# Patient Record
Sex: Male | Born: 1960 | Race: White | Hispanic: No | Marital: Single | Smoking: Never smoker
Health system: Southern US, Community
[De-identification: ages and names within clinical notes are randomized; demographics above are authoritative.]

---

## 2013-10-14 ENCOUNTER — Ambulatory Visit: Payer: Self-pay | Admitting: Physician Assistant

## 2015-06-29 ENCOUNTER — Encounter: Payer: Self-pay | Admitting: Emergency Medicine

## 2015-06-29 ENCOUNTER — Ambulatory Visit (INDEPENDENT_AMBULATORY_CARE_PROVIDER_SITE_OTHER): Payer: Self-pay

## 2015-06-29 ENCOUNTER — Ambulatory Visit
Admission: EM | Admit: 2015-06-29 | Discharge: 2015-06-29 | Disposition: A | Discharge status: Home / Self Care | Payer: Self-pay | Attending: Family Medicine | Admitting: Family Medicine

## 2015-06-29 DIAGNOSIS — S022XXA Fracture of nasal bones, initial encounter for closed fracture: Secondary | ICD-10-CM

## 2015-06-29 DIAGNOSIS — T148XXA Other injury of unspecified body region, initial encounter: Secondary | ICD-10-CM

## 2015-06-29 DIAGNOSIS — R040 Epistaxis: Secondary | ICD-10-CM

## 2015-06-29 DIAGNOSIS — T148 Other injury of unspecified body region: Secondary | ICD-10-CM

## 2015-06-29 MED ORDER — AMOXICILLIN-POT CLAVULANATE 875-125 MG PO TABS: 1.0000 | ORAL_TABLET | Freq: Two times a day (BID) | ORAL | Status: AC

## 2015-06-29 MED ORDER — TRIPLE ANTIBIOTIC 5-400-5000 EX OINT: TOPICAL_OINTMENT | Freq: Two times a day (BID) | CUTANEOUS | Status: AC

## 2015-06-29 MED ORDER — OXYMETAZOLINE HCL 0.05 % NA SOLN: 1.0000 | Freq: Two times a day (BID) | NASAL | Status: AC

## 2015-06-29 MED ORDER — ACETAMINOPHEN 500 MG PO TABS
1000.0000 mg | ORAL_TABLET | Freq: Four times a day (QID) | ORAL | Status: AC | PRN
Start: 2015-06-29 — End: ?

## 2015-06-29 NOTE — ED Notes (Signed)
Ice pack applied to nose.

## 2015-06-29 NOTE — Discharge Instructions (Signed)
Nasal Fracture A nasal fracture is a break or crack in the bones or cartilage of the nose. Minor breaks do not require treatment. These breaks usually heal on their own after about one month. Serious breaks may require surgery. CAUSES This injury is usually caused by a blunt injury to the nose. This type of injury often occurs from:  Contact sports.  Car accidents.  Falls.  Getting punched. SYMPTOMS Symptoms of this injury include:  Pain.  Swelling of the nose.  Bleeding from the nose.  Bruising around the nose or eyes. This may include having black eyes.  Crooked appearance of the nose. DIAGNOSIS This injury may be diagnosed with a physical exam. The health care provider will gently feel the nose for signs of broken bones. He or she will look inside the nostrils to make sure that there is not a blood-filled swelling on the dividing wall between the nostrils (septal hematoma). X-rays of the nose may not show a nasal fracture even when one is present. In some cases, X-rays or a CT scan may be done 1-5 days after the injury. Sometimes, the health care provider will want to wait until the swelling has gone down. TREATMENT Often, minor fractures that have caused no deformity do not require treatment. More serious fractures in which bones have moved out of position may require surgery, which will take place after the swelling is gone. Surgery will stabilize and align the fracture. In some cases, a health care provider may be able to reposition the bones without surgery. This may be done in the health care provider's office after medicine is given to numb the area (local anesthetic). HOME CARE INSTRUCTIONS  If directed, apply ice to the injured area:  Put ice in a plastic bag.  Place a towel between your skin and the bag.  Leave the ice on for 20 minutes, 2-3 times per day.  Take over-the-counter and prescription medicines only as told by your health care provider.  If your nose  starts to bleed, sit in an upright position while you squeeze the soft parts of your nose against the dividing wall between your nostrils (septum) for 10 minutes.  Try to avoid blowing your nose.  Return to your normal activities as told by your health care provider. Ask your health care provider what activities are safe for you.  Avoid contact sports for 3-4 weeks or as told by your health care provider.  Keep all follow-up visits as told by your health care provider. This is important. SEEK MEDICAL CARE IF:  Your pain increases or becomes severe.  You continue to have nosebleeds.  The shape of your nose does not return to normal within 5 days.  You have pus draining out of your nose. SEEK IMMEDIATE MEDICAL CARE IF:  You have bleeding from your nose that does not stop after you pinch your nostrils closed for 20 minutes and keep ice on your nose.  You have clear fluid draining out of your nose.  You notice a grape-like swelling on the septum. This swelling is a collection of blood (hematoma) that must be drained to help prevent infection.  You have difficulty moving your eyes.  You have repeated vomiting.   This information is not intended to replace advice given to you by your health care provider. Make sure you discuss any questions you have with your health care provider.   Document Released: 07/01/2000 Document Revised: 03/25/2015 Document Reviewed: 08/11/2014 Elsevier Interactive Patient Education [employer].  Nosebleed Nosebleeds are common. They are due to a crack in the inside lining of your nose (mucous membrane) or from a small blood vessel that starts to bleed. Nosebleeds can be caused by many conditions, such as injury, infections, dry mucous membranes or dry climate, medicines, nose picking, and home heating and cooling systems. Most nosebleeds come from blood vessels in the front of your nose. HOME CARE INSTRUCTIONS   Try controlling your nosebleed by  pinching your nostrils gently and continuously for at least 10 minutes.  Avoid blowing or sniffing your nose for a number of hours after having a nosebleed.  Do not put gauze inside your nose yourself. If your nose was packed by your health care provider, try to maintain the pack inside of your nose until your health care provider removes it.  If a gauze pack was used and it starts to fall out, gently replace it or cut off the end of it.  If a balloon catheter was used to pack your nose, do not cut or remove it unless your health care provider has instructed you to do that.  Avoid lying down while you are having a nosebleed. Sit up and lean forward.  Use a nasal spray decongestant to help with a nosebleed as directed by your health care provider.  Do not use petroleum jelly or mineral oil in your nose. These can drip into your lungs.  Maintain humidity in your home by using less air conditioning or by using a humidifier.  Aspirinand blood thinners make bleeding more likely. If you are prescribed these medicines and you suffer from nosebleeds, ask your health care provider if you should stop taking the medicines or adjust the dose. Do not stop medicines unless directed by your health care provider  Resume your normal activities as you are able, but avoid straining, lifting, or bending at the waist for several days.  If your nosebleed was caused by dry mucous membranes, use over-the-counter saline nasal spray or gel. This will keep the mucous membranes moist and allow them to heal. If you must use a lubricant, choose the water-soluble variety. Use it only sparingly, and do not use it within several hours of lying down.  Keep all follow-up visits as directed by your health care provider. This is important. SEEK MEDICAL CARE IF:  You have a fever.  You get frequent nosebleeds.  You are getting nosebleeds more often. SEEK IMMEDIATE MEDICAL CARE IF:  Your nosebleed lasts longer than 20  minutes.  Your nosebleed occurs after an injury to your face, and your nose looks crooked or broken.  You have unusual bleeding from other parts of your body.  You have unusual bruising on other parts of your body.  You feel light-headed or you faint.  You become sweaty.  You vomit blood.  Your nosebleed occurs after a head injury.   This information is not intended to replace advice given to you by your health care provider. Make sure you discuss any questions you have with your health care provider.   Document Released: 04/13/2005 Document Revised: 07/25/2014 Document Reviewed: 02/17/2014 Elsevier Interactive Patient Education 2016 [employer] Inc[employer] Abrasion An abrasion is a cut or scrape on the outer surface of your skin. An abrasion does not extend through all of the layers of your skin. It is important to care for your abrasion properly to prevent infection. CAUSES Most abrasions are caused by falling on or gliding across the ground or another surface. When your skin rubs  on something, the outer and inner layer of skin rubs off.  SYMPTOMS A cut or scrape is the main symptom of this condition. The scrape may be bleeding, or it may appear red or pink. If there was an associated fall, there may be an underlying bruise. DIAGNOSIS An abrasion is diagnosed with a physical exam. TREATMENT Treatment for this condition depends on how large and deep the abrasion is. Usually, your abrasion will be cleaned with water and mild soap. This removes any dirt or debris that may be stuck. An antibiotic ointment may be applied to the abrasion to help prevent infection. A bandage (dressing) may be placed on the abrasion to keep it clean. You may also need a tetanus shot. HOME CARE INSTRUCTIONS Medicines  Take or apply medicines only as directed by your health care provider.  If you were prescribed an antibiotic ointment, finish all of it even if you start to feel better. Wound Care  Clean the  wound with mild soap and water 2-3 times per day or as directed by your health care provider. Pat your wound dry with a clean towel. Do not rub it.  There are many different ways to close and cover a wound. Follow instructions from your health care provider about:  Wound care.  Dressing changes and removal.  Check your wound every day for signs of infection. Watch for:  Redness, swelling, or pain.  Fluid, blood, or pus. General Instructions  Keep the dressing dry as directed by your health care provider. Do not take baths, swim, use a hot tub, or do anything that would put your wound underwater until your health care provider approves.  If there is swelling, raise (elevate) the injured area above the level of your heart while you are sitting or lying down.  Keep all follow-up visits as directed by your health care provider. This is important. SEEK MEDICAL CARE IF:  You received a tetanus shot and you have swelling, severe pain, redness, or bleeding at the injection site.  Your pain is not controlled with medicine.  You have increased redness, swelling, or pain at the site of your wound. SEEK IMMEDIATE MEDICAL CARE IF:  You have a red streak going away from your wound.  You have a fever.  You have fluid, blood, or pus coming from your wound.  You notice a bad smell coming from your wound or your dressing.   This information is not intended to replace advice given to you by your health care provider. Make sure you discuss any questions you have with your health care provider.   Document Released: 04/13/2005 Document Revised: 03/25/2015 Document Reviewed: 07/02/2014 Elsevier Interactive Patient Education [employer]

## 2015-06-29 NOTE — ED Notes (Signed)
Patient states that he got hit in the nose by a bucket.  Patient states that [place] and it bounced off and hit him in the nose.  Patient has bleeding and swelling from his nose.

## 2015-06-29 NOTE — ED Provider Notes (Signed)
CSN: [id number]     Arrival date & time 06/29/15  0940 History   First MD Initiated Contact with Patient 06/29/15 1022     Chief Complaint  Patient presents with  . Facial Injury  . Epistaxis   (Consider location/radiation/quality/duration/timing/severity/associated sxs/prior Treatment) HPI Comments: [place] male single here for evaluation of nose/nosebleed "think I broke it"  Feeding bull this am and bull charged him swung empty feed bucket and accidentally hit self in nose  "I feel like my nose bones loose  Nosebleed slowing down now on aspirin daily  Anxious not hurting bad occurred couple hours ago  Denied LOC, visual changes, dyspnea, nausea, vomiting, headache; finished chores and came here denied history nasal fracture   Denied hypertension, cancer PMHx  Denied PSHx  Denied FHx cancer  Patient is a 54 y.o. male presenting with facial injury and nosebleeds. The history is provided by the patient.  Facial Injury Mechanism of injury:  Direct blow Location:  Nose Time since incident:  2 hours Pain details:    Quality:  Aching   Severity:  Moderate   Duration:  2 hours   Timing:  Constant   Progression:  Unchanged Chronicity:  New Relieved by:  Nothing Worsened by:  Movement and pressure Ineffective treatments:  Ice pack Associated symptoms: congestion and epistaxis   Associated symptoms: no altered mental status, no difficulty breathing, no double vision, no ear pain, no headaches, no loss of consciousness, no malocclusion, no nausea, no neck pain, no rhinorrhea, no trismus, no vomiting and no wheezing   Congestion:    Location:  Nasal   Interferes with sleep: no     Interferes with eating/drinking: no   Risk factors: trauma   Risk factors: no alcohol use, no bone disorder, no concern for non-accidental trauma, no frequent falls and no prior injuries to these areas   Epistaxis Associated symptoms: congestion   Associated symptoms: no cough, no dizziness, no fever, no  headaches, no sneezing and no sore throat     History reviewed. No pertinent past medical history. History reviewed. No pertinent past surgical history. History reviewed. No pertinent family history. Social History  Substance Use Topics  . Smoking status: Never Smoker   . Smokeless tobacco: None  . Alcohol Use: Yes    Review of Systems  Constitutional: Negative for fever, chills, diaphoresis, activity change, appetite change, fatigue and unexpected weight change.  HENT: Positive for congestion, nosebleeds and sinus pressure. Negative for dental problem, drooling, ear discharge, ear pain, facial swelling, hearing loss, mouth sores, postnasal drip, rhinorrhea, sneezing, sore throat, tinnitus, trouble swallowing and voice change.   Eyes: Negative for double vision, photophobia, pain, discharge, redness, itching and visual disturbance.  Respiratory: Negative for cough, choking, chest tightness, shortness of breath, wheezing and stridor.   Cardiovascular: Negative for chest pain, palpitations and leg swelling.  Gastrointestinal: Negative for nausea, vomiting, abdominal pain, diarrhea, constipation, blood in stool and abdominal distention.  Endocrine: Negative for cold intolerance and heat intolerance.  Genitourinary: Negative for dysuria.  Musculoskeletal: Negative for myalgias, back pain, joint swelling, arthralgias, gait problem, neck pain and neck stiffness.  Skin: Negative for color change, pallor, rash and wound.  Allergic/Immunologic: Positive for environmental allergies. Negative for food allergies and immunocompromised state.  Neurological: Negative for dizziness, tremors, seizures, loss of consciousness, syncope, facial asymmetry, speech difficulty, weakness, light-headedness, numbness and headaches.  Hematological: Negative for adenopathy. Does not bruise/bleed easily.  Psychiatric/Behavioral: Negative for behavioral problems, confusion, sleep disturbance and agitation.  Allergies  Review of patient's allergies indicates no known allergies.  Home Medications   Prior to Admission medications   Medication Sig Start Date End Date Taking? Authorizing Provider  aspirin 81 MG tablet Take 81 mg by mouth daily.   Yes Historical Provider, MD  acetaminophen (TYLENOL) 500 MG tablet Take 2 tablets (1,000 mg total) by mouth every 6 (six) hours as needed for mild pain, moderate pain or headache. 06/29/15   Barbaraann Barthelina A Ankith Edmonston, NP  amoxicillin-clavulanate (AUGMENTIN) 875-125 MG tablet Take 1 tablet by mouth every 12 (twelve) hours. 06/29/15   Barbaraann Barthelina A Terin Cragle, NP  neomycin-bacitracin-polymyxin (NEOSPORIN) [phone] ointment Apply topically 2 (two) times daily. Affected area nose x 10 days 06/29/15 07/08/15  Barbaraann Barthelina A Armenta Erskin, NP  oxymetazoline (AFRIN NASAL SPRAY) 0.05 % nasal spray Place 1 spray into both nostrils 2 (two) times daily. 06/29/15 07/01/15  Barbaraann Barthelina A Mckynlie Vanderslice, NP   Meds Ordered and Administered this Visit  Medications - No data to display  BP 175/94 mmHg  Pulse 55  Temp(Src) 96.3 F (35.7 C) (Tympanic)  Resp 16  Ht 5\' 6"  (1.676 m)  Wt 190 lb (86.183 kg)  BMI 30.68 kg/m2  SpO2 99% No data found.   Physical Exam  Constitutional: He is oriented to person, place, and time. Vital signs are normal. He appears well-developed and well-nourished. He is active and cooperative.  Non-toxic appearance. He does not have a sickly appearance. He does not appear ill. No distress.  HENT:  Head: Normocephalic and atraumatic.  Right Ear: Hearing, external ear and ear canal normal. A middle ear effusion is present.  Left Ear: Hearing, external ear and ear canal normal. A middle ear effusion is present.  Nose: Mucosal edema, rhinorrhea and sinus tenderness present. No nose lacerations, nasal deformity, septal deviation or nasal septal hematoma. Epistaxis is observed.  No foreign bodies. Right sinus exhibits no maxillary sinus tenderness and no frontal sinus  tenderness. Left sinus exhibits no maxillary sinus tenderness and no frontal sinus tenderness.    Mouth/Throat: Uvula is midline and mucous membranes are normal. Mucous membranes are not pale, not dry and not cyanotic. He does not have dentures. No oral lesions. No trismus in the jaw. Normal dentition. No dental abscesses, uvula swelling, lacerations or dental caries. Posterior oropharyngeal edema and posterior oropharyngeal erythema present. No oropharyngeal exudate or tonsillar abscesses.  Eyes: Conjunctivae, EOM and lids are normal. Pupils are equal, round, and reactive to light. Right eye exhibits no chemosis, no discharge, no exudate and no hordeolum. No foreign body present in the right eye. Left eye exhibits no chemosis, no discharge, no exudate and no hordeolum. No foreign body present in the left eye. Right conjunctiva is not injected. Right conjunctiva has no hemorrhage. Left conjunctiva is not injected. Left conjunctiva has no hemorrhage. No scleral icterus. Right eye exhibits normal extraocular motion and no nystagmus. Left eye exhibits normal extraocular motion and no nystagmus. Right pupil is round and reactive. Left pupil is round and reactive. Pupils are equal.  Neck: Trachea normal and normal range of motion. Neck supple. No tracheal tenderness, no spinous process tenderness and no muscular tenderness present. No rigidity. No tracheal deviation, no edema, no erythema and normal range of motion present. No thyroid mass and no thyromegaly present.  Cardiovascular: Normal rate, regular rhythm, S1 normal, S2 normal, normal heart sounds and intact distal pulses.  PMI is not displaced.  Exam reveals no gallop and no friction rub.   No murmur heard. Pulmonary/Chest: Effort normal and  breath sounds normal. No stridor. No respiratory distress. He has no decreased breath sounds. He has no wheezes. He has no rhonchi. He has no rales.  Abdominal: Soft. Normal appearance. He exhibits no distension.   Musculoskeletal: Normal range of motion. He exhibits no edema or tenderness.  Lymphadenopathy:       Head (right side): No submental, no submandibular, no tonsillar, no preauricular, no posterior auricular and no occipital adenopathy present.       Head (left side): No submental, no submandibular, no tonsillar, no preauricular, no posterior auricular and no occipital adenopathy present.    He has no cervical adenopathy.       Right cervical: No superficial cervical, no deep cervical and no posterior cervical adenopathy present.      Left cervical: No superficial cervical, no deep cervical and no posterior cervical adenopathy present.  Neurological: He is alert and oriented to person, place, and time. He displays no atrophy and no tremor. No cranial nerve deficit or sensory deficit. He exhibits normal muscle tone. He displays no seizure activity. Coordination and gait normal. GCS eye subscore is 4. GCS verbal subscore is 5. GCS motor subscore is 6.  Skin: Skin is warm and dry. Abrasion noted. No bruising, no burn, no ecchymosis, no laceration, no lesion, no petechiae and no rash noted. He is not diaphoretic. There is erythema. No cyanosis. No pallor. Nails show no clubbing.  Psychiatric: He has a normal mood and affect. His speech is normal and behavior is normal. Judgment and thought content normal. Cognition and memory are normal.  Nursing note and vitals reviewed.   ED Course  Procedures (including critical care time)  Labs Review Labs Reviewed - No data to display  Imaging Review Dg Nasal Bones  06/29/2015  CLINICAL DATA:  Nasal injury. EXAM: NASAL BONES - 3+ VIEW COMPARISON:  None. FINDINGS: There is a comminuted fracture of the nasal bone. There is no other fracture. The orbital floors are intact. The nasal septum is midline. The visualized paranasal sinuses are clear. IMPRESSION: Comminuted fracture of the nasal bone. Electronically Signed   By: Elige Ko   On: 06/29/2015 10:37     1100 discussed xray results with patient given copy of radiology report and reviewed images with him well aligned no splint required at this time.  Epistaxis resolved.  Patient denied pain/headache.  Discussed it is possible he may have two black eyes later this week due to pooling blood from nose.  Avoid exertion today.  Avoid blowing nose.  Avoid further trauma to nose.  Cryotherapy, tylenol.  Patient verabalized understanding of information/instructions, agreed with plan of care and had no further questions at this time.  MDM   1. Nasal fracture, closed, initial encounter   2. Epistaxis   3. Abrasion    Apply ice to bridge of nose if bleeding from nares reoccurs, lean forward, consider afrin use.  Avoid blowing nose.  May sniff afrin 1 spray each nostril BID for nosebleeds also.  Hold his daily aspirin for the next couple of days avoid motrin/naproxen as can increase bleeding.  Tylenol  po QID prn pain.  Neosporin/triple antibiotic to abrasion bridge of nose.  Avoid moving nasal bones currently in alignment.  If new or worsening symptoms follow up for re-evaluation consider ENT Dr Elenore Rota information given.  Augmentin  po BID x 10 days.  Given hibiclens to shower over nose this afternoon/evening and then reapply triple antibiotic and bandaid over top.  Monitor for worst headache  of life, erythema, purulent discharge, fever, chills, worsening pain and follow up for re-evaluation.  Cryotherapy TID prn.  Use humidifier as low humidity cold front hvac running frequently.   Elevate head.  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  Exitcare handout on abrasion, epistaxis, nasal fracture given to patient.  Patient verbalized understanding of information/instructions, agreed with plan of care and had no further questions at this time.  Probably related to anxiety and pain.  Anxious as no health insurance regarding costs of visit and that  surgery might be required relieved when discussed probably will not require surgery.  Discussed hypertension diagnosis if BP greater than 140/90 when not sick or injured.  Follow up with [facility] for re-evaluation of blood pressure.  Continue to monitor blood pressure at home and maintain log of blood pressure and pulse to bring to follow up appointments.  Continue low sodium diet and exercise program.  Recommended weight loss/weight maintenance to BMI 20-25.  Return to the clinic if any new symptoms.  Patient verbalized agreement and understanding of treatment plan and had no further questions at this time.   P2:  Diet and Exercise specific for HTN   Barbaraann Barthel, NP 06/29/15 2033

## 2015-12-23 ENCOUNTER — Ambulatory Visit
Admission: EM | Admit: 2015-12-23 | Discharge: 2015-12-23 | Disposition: A | Discharge status: Home / Self Care | Payer: Self-pay | Attending: Family Medicine | Admitting: Family Medicine

## 2015-12-23 DIAGNOSIS — Z0289 Encounter for other administrative examinations: Secondary | ICD-10-CM

## 2015-12-23 LAB — DEPT OF TRANSP DIPSTICK, URINE (ARMC ONLY)
GLUCOSE, UA: NEGATIVE mg/dL
Hgb urine dipstick: NEGATIVE
PROTEIN: NEGATIVE mg/dL
Specific Gravity, Urine: 1.015 (ref 1.005–1.030)

## 2015-12-23 NOTE — ED Provider Notes (Signed)
CSN: [id number]     Arrival date & time 12/23/15  0820 History   First MD Initiated Contact with Patient 12/23/15 0901     No chief complaint on file.  (Consider location/radiation/quality/duration/timing/severity/associated sxs/prior Treatment) HPI Comments: Patient here for DOT Physical (see scanned form)   The history is provided by the patient.    No past medical history on file. No past surgical history on file. No family history on file. Social History  Substance Use Topics  . Smoking status: Never Smoker   . Smokeless tobacco: Not on file  . Alcohol Use: Yes    Review of Systems  Allergies  Review of patient's allergies indicates no known allergies.  Home Medications   Prior to Admission medications   Medication Sig Start Date End Date Taking? Authorizing Provider  acetaminophen (TYLENOL) 500 MG tablet Take 2 tablets (1,000 mg total) by mouth every 6 (six) hours as needed for mild pain, moderate pain or headache. 06/29/15   Barbaraann Barthelina A Betancourt, NP  amoxicillin-clavulanate (AUGMENTIN) 875-125 MG tablet Take 1 tablet by mouth every 12 (twelve) hours. 06/29/15   Barbaraann Barthelina A Betancourt, NP  aspirin 81 MG tablet Take 81 mg by mouth daily.    Historical Provider, MD   Meds Ordered and Administered this Visit  Medications - No data to display  BP 135/95 mmHg  Pulse 69  Temp(Src) 97.9 F (36.6 C) (Oral)  Resp 16  Ht 5\' 6"  (1.676 m)  Wt 199 lb 3.2 oz (90.357 kg)  BMI 32.17 kg/m2  SpO2 99% No data found.   Physical Exam  ED Course  Procedures (including critical care time)  Labs Review Labs Reviewed  DEPT OF TRANSP DIPSTICK, URINE([facility] ONLY)    Imaging Review No results found.   Visual Acuity Review  Right Eye Distance:   Left Eye Distance:   Bilateral Distance:    Right Eye Near:   Left Eye Near:    Bilateral Near:         MDM   1. Encounter for examination required by Department of Transportation (DOT)     DOT Physical  (medically qualified for 1 year wearing corrective lenses; establish care with PCP for follow up on mildly elevated diastolic bp ; see scanned form)    Payton Mccallumrlando Jeanne Terrance, MD 12/23/15 1004

## 2017-05-01 IMAGING — CR DG NASAL BONES 3+V
3 series · 3 of 3 positions shown · non-contrast
Comparison: None.

CLINICAL DATA: Nasal injury.

EXAM:
NASAL BONES - 3+ VIEW

[[person_name]]
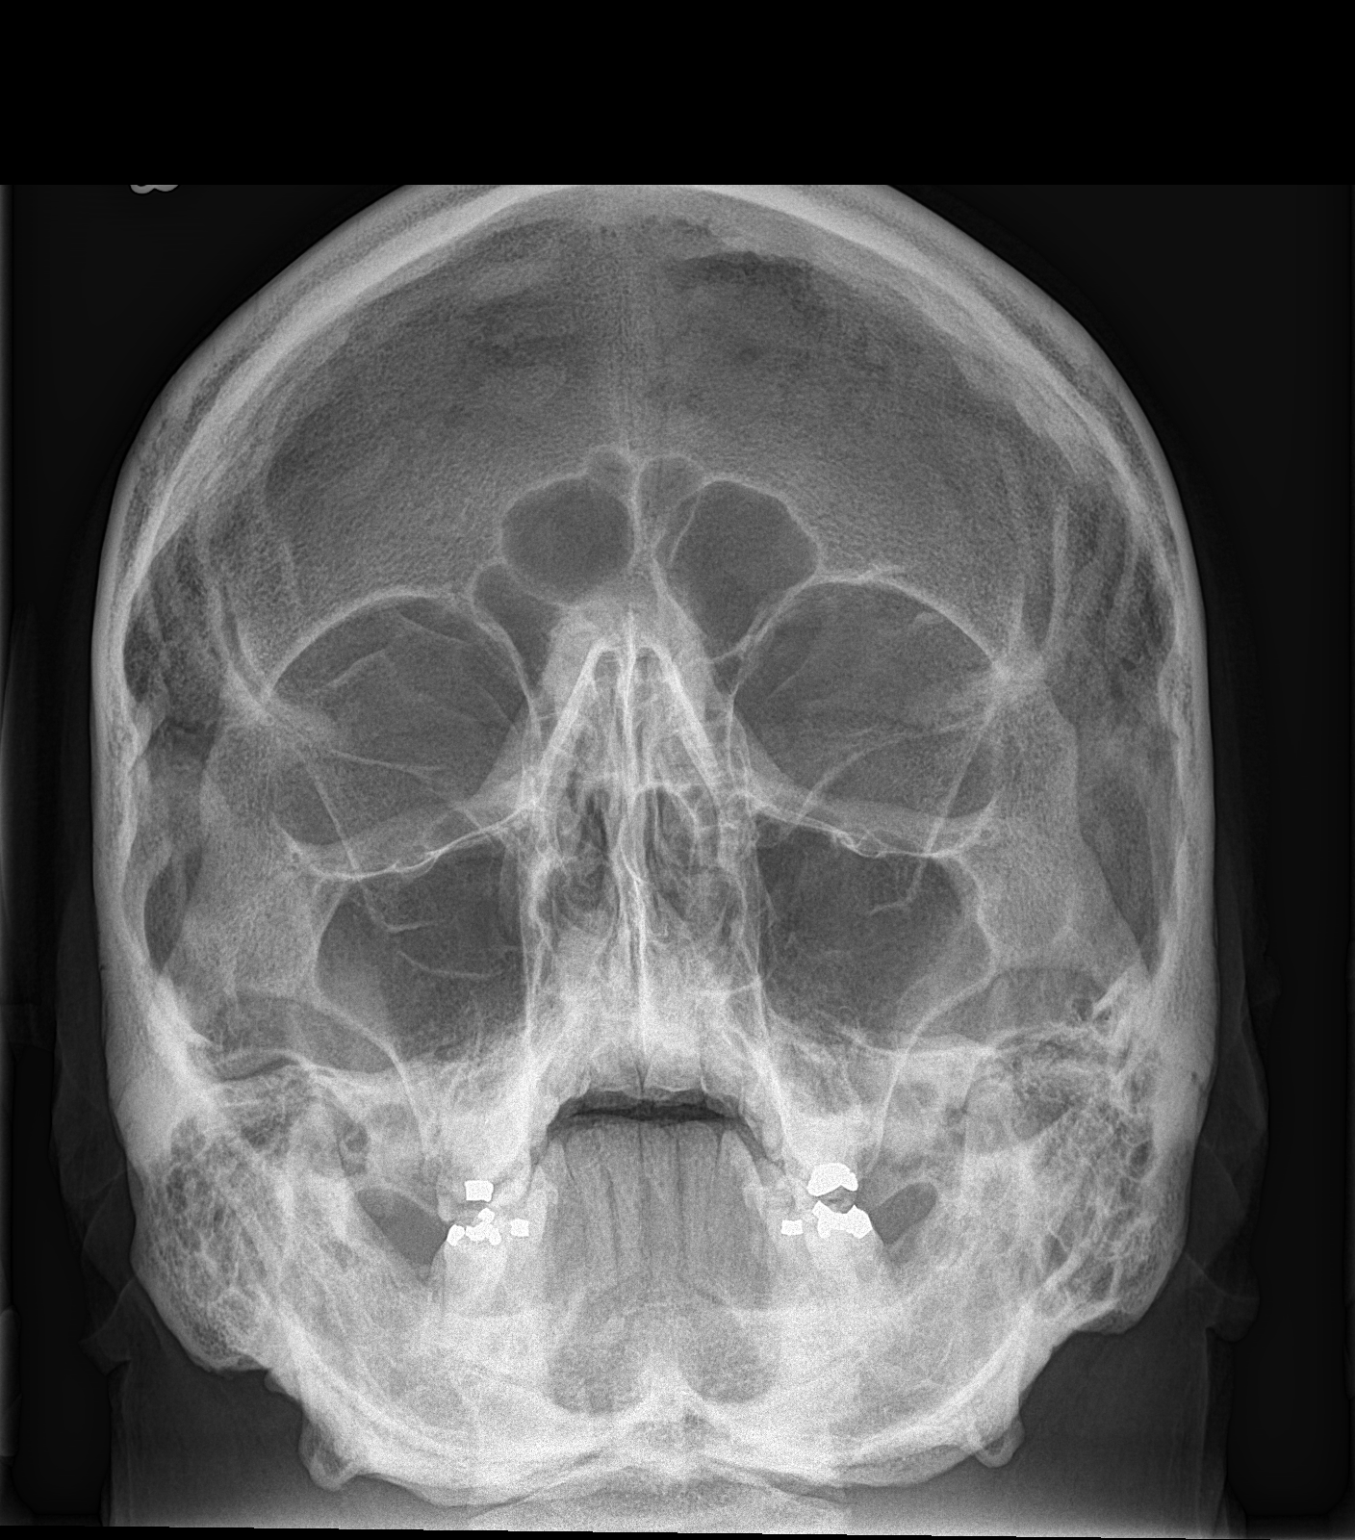

[nasal bones lat (1 of 2)]
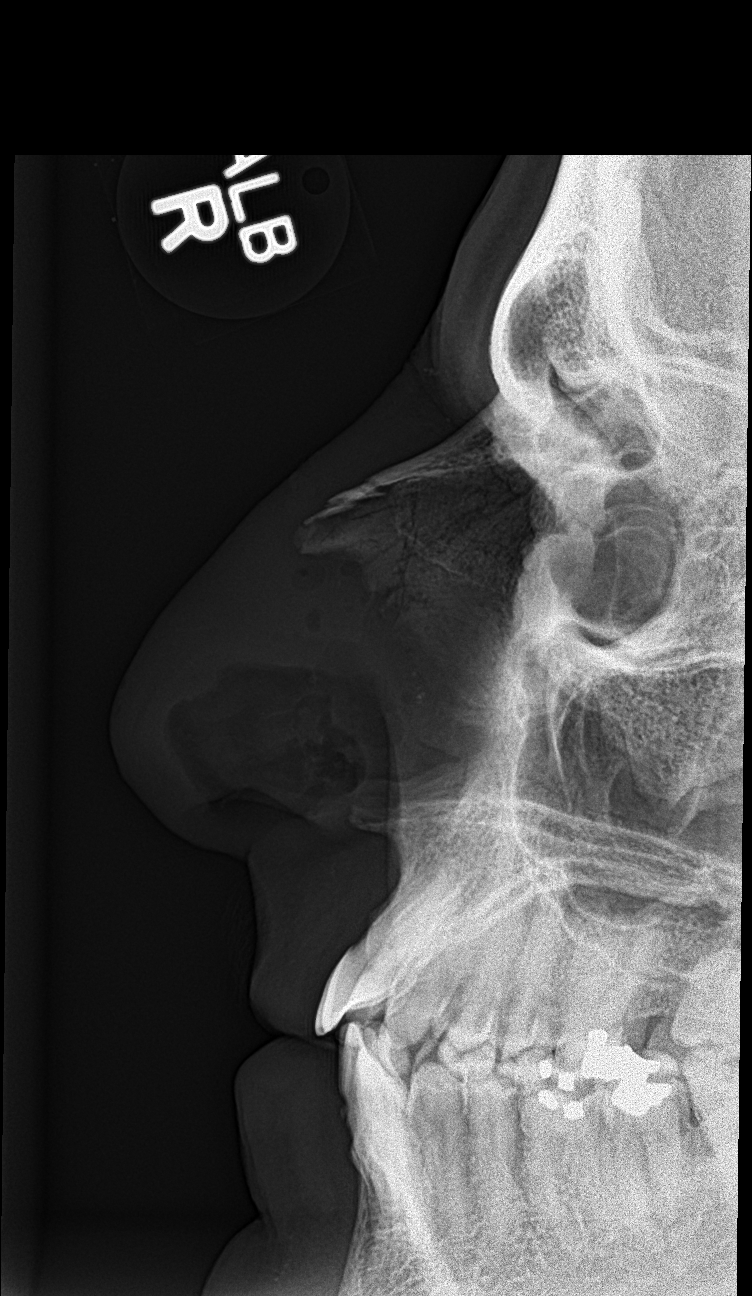

[nasal bones lat (2 of 2)]
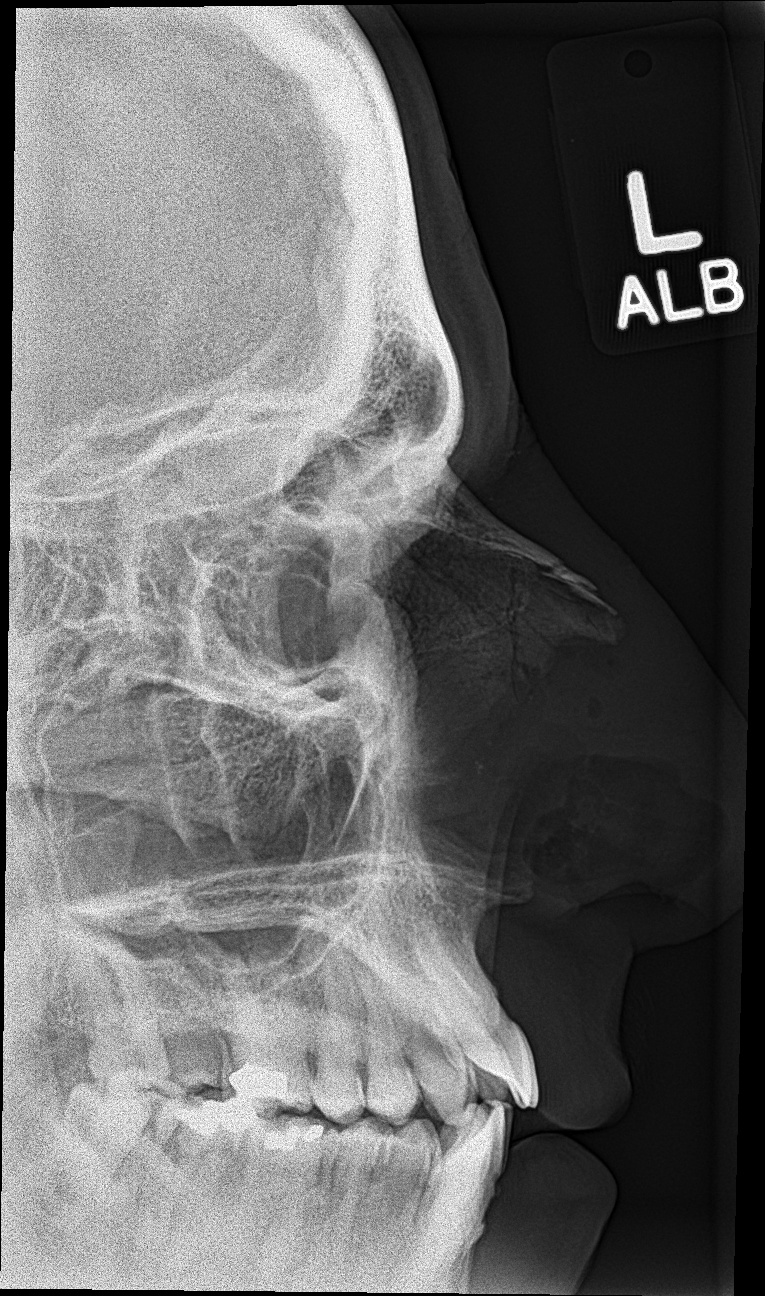

[3 of 3 positions shown; findings below may reference images not displayed]

FINDINGS: There is a comminuted fracture of the nasal bone. There is no other
fracture. The orbital floors are intact. The nasal septum is
midline. The visualized paranasal sinuses are clear.
IMPRESSION: Comminuted fracture of the nasal bone.
